# Patient Record
Sex: Male | Born: 1995 | Race: White | Hispanic: No | Smoking: Never smoker
Health system: Southern US, Community
[De-identification: ages and names within clinical notes are randomized; demographics above are authoritative.]

## PROBLEM LIST (undated history)

## (undated) DIAGNOSIS — Z8781 Personal history of (healed) traumatic fracture: Secondary | ICD-10-CM

## (undated) DIAGNOSIS — T791XXA Fat embolism (traumatic), initial encounter: Secondary | ICD-10-CM

## (undated) HISTORY — DX: Personal history of (healed) traumatic fracture: Z87.81

## (undated) HISTORY — DX: Fat embolism (traumatic), initial encounter: T79.1XXA

---
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
## 2016-06-30 ENCOUNTER — Encounter: Payer: Self-pay | Admitting: Family Medicine

## 2016-06-30 ENCOUNTER — Ambulatory Visit (INDEPENDENT_AMBULATORY_CARE_PROVIDER_SITE_OTHER): Payer: 59 | Admitting: Family Medicine

## 2016-06-30 VITALS — BP 110/62 | HR 82 | Temp 98.4°F | Resp 14 | Wt 146.0 lb

## 2016-06-30 DIAGNOSIS — L609 Nail disorder, unspecified: Secondary | ICD-10-CM | POA: Insufficient documentation

## 2016-06-30 NOTE — Assessment & Plan Note (Signed)
Appears to be a medium brown pigment growing out from the base of the nail; it is new, and I'd like this checked by dermatologist; referral entered

## 2016-06-30 NOTE — Progress Notes (Signed)
BP 110/62 mmHg  Pulse 82  Temp(Src) 98.4 F (36.9 C) (Oral)  Resp 14  Wt 146 lb (66.225 kg)  SpO2 96%   Subjective:    Patient ID: Donald Ferguson, male    DOB: [birth year], 20 y.o.   MRN: [id number]  HPI: Donald Ferguson is a 20 y.o. male  Chief Complaint  Patient presents with  . Nail Problem   Patient is here with his mother; has dark line in the right thumb; no pain whatsoever; does not affect the nail at all; does not feel bump or ridge there; not getting any wider; no pain; no other changes in any other nails No SHOB; no fevers, no chest pain; good energy level is good  Patient had an accident on a skateboard last July, had a fat embolism to the lung; in [place], at [facility] in [place]; broke left arm Different arm  Depression screen [facility] 2/9 06/30/2016 12/31/2015 10/05/2015 07/08/2015  Decreased Interest 0 0 0 0  Down, Depressed, Hopeless 0 0 0 0  PHQ - 2 Score 0 0 0 0   Relevant past medical, surgical, family and social history reviewed Past Medical History  Diagnosis Date  . Fat embolism (traumatic) (HCC)   . History of humerus fracture    History reviewed. No pertinent past surgical history.   Family History  Problem Relation Age of Onset  . Asthma Father   MD note: positive for diabetes and hypertension in grandparents on mother's side  Social History  Substance Use Topics  . Smoking status: Never Smoker   . Smokeless tobacco: None  . Alcohol Use: No   Interim medical history since last visit reviewed. Allergies and medications reviewed  Review of Systems Per HPI unless specifically indicated above     Objective:    BP 110/62 mmHg  Pulse 82  Temp(Src) 98.4 F (36.9 C) (Oral)  Resp 14  Wt 146 lb (66.225 kg)  SpO2 96%  Wt Readings from Last 3 Encounters:  06/30/16 146 lb (66.225 kg) (40 %*, Z = -0.26)  12/31/15 143 lb 8 oz (65.091 kg) (39 %*, Z = -0.29)  10/05/15 139 lb 12.8 oz (63.413 kg) (34 %*, Z = -0.41)   *  Growth percentiles are based on CDC 2-20 Years data.    Physical Exam  Constitutional: He appears well-developed and well-nourished. No distress.  HENT:  Head: Normocephalic and atraumatic.  Cardiovascular: Normal rate, regular rhythm and normal heart sounds.  Exam reveals no friction rub.   No murmur heard. Pulmonary/Chest: Effort normal and breath sounds normal. He has no wheezes.  Skin: Skin is warm and dry. No pallor.  1 mm uniformly brown pigmented stripe in the fingernail of the right thumb; no swelling over the cuticle; nontender; no palpable ridges; no other changes noted in the nails of the fingers or toes; no splinter hemorrhages  Psychiatric: He has a normal mood and affect. His behavior is normal.      Assessment & Plan:   Problem List Items Addressed This Visit      Musculoskeletal and Integument   Nail abnormality - Primary    Appears to be a medium brown pigment growing out from the base of the nail; it is new, and I'd like this checked by dermatologist; referral entered      Relevant Orders   Ambulatory referral to Dermatology      Follow up plan: No Follow-up on file.  An after-visit summary was printed and  given to the patient at check-out.  Please see the patient instructions which may contain other information and recommendations beyond what is mentioned above in the assessment and plan.  No orders of the defined types were placed in this encounter.    Orders Placed This Encounter  Procedures  . Ambulatory referral to Dermatology

## 2016-06-30 NOTE — Patient Instructions (Addendum)
We'll have you see the dermatologist Good luck, Go Pack!

## 2017-08-04 ENCOUNTER — Emergency Department
Admission: EM | Admit: 2017-08-04 | Discharge: 2017-08-04 | Disposition: A | Discharge status: Home / Self Care | Payer: Self-pay | Attending: Emergency Medicine | Admitting: Emergency Medicine

## 2017-08-04 ENCOUNTER — Encounter: Payer: Self-pay | Admitting: *Deleted

## 2017-08-04 ENCOUNTER — Emergency Department: Payer: Self-pay

## 2017-08-04 DIAGNOSIS — K1121 Acute sialoadenitis: Secondary | ICD-10-CM | POA: Insufficient documentation

## 2017-08-04 LAB — CBC WITH DIFFERENTIAL/PLATELET
Basophils Absolute: 0.1 10*3/uL (ref 0–0.1)
Basophils Relative: 0 %
EOS PCT: 0 %
Eosinophils Absolute: 0 10*3/uL (ref 0–0.7)
HCT: 41.5 % (ref 40.0–52.0)
Hemoglobin: 14.3 g/dL (ref 13.0–18.0)
LYMPHS ABS: 1 10*3/uL (ref 1.0–3.6)
LYMPHS PCT: 8 %
MCH: 30.4 pg (ref 26.0–34.0)
MCHC: 34.5 g/dL (ref 32.0–36.0)
MCV: 88.2 fL (ref 80.0–100.0)
Monocytes Absolute: 1.7 10*3/uL — ABNORMAL HIGH (ref 0.2–1.0)
Monocytes Relative: 15 %
Neutro Abs: 9.2 10*3/uL — ABNORMAL HIGH (ref 1.4–6.5)
Neutrophils Relative %: 77 %
PLATELETS: 255 10*3/uL (ref 150–440)
RBC: 4.7 MIL/uL (ref 4.40–5.90)
RDW: 13 % (ref 11.5–14.5)
WBC: 12 10*3/uL — ABNORMAL HIGH (ref 3.8–10.6)

## 2017-08-04 LAB — BASIC METABOLIC PANEL
ANION GAP: 9 (ref 5–15)
BUN: 12 mg/dL (ref 6–20)
CHLORIDE: 103 mmol/L (ref 101–111)
CO2: 27 mmol/L (ref 22–32)
Calcium: 9.4 mg/dL (ref 8.9–10.3)
Creatinine, Ser: 0.86 mg/dL (ref 0.61–1.24)
GFR calc Af Amer: >60 mL/min (ref >60)
GFR calc non Af Amer: >60 mL/min (ref >60)
Glucose, Bld: 79 mg/dL (ref 65–99)
POTASSIUM: 4 mmol/L (ref 3.5–5.1)
Sodium: 139 mmol/L (ref 135–145)

## 2017-08-04 MED ORDER — DEXAMETHASONE SODIUM PHOSPHATE 10 MG/ML IJ SOLN
20.0000 mg | Freq: Once | INTRAMUSCULAR | Status: AC
  Administered 2017-08-04: 20 mg via INTRAVENOUS
  Filled 2017-08-04: qty 2

## 2017-08-04 MED ORDER — IOPAMIDOL (ISOVUE-300) INJECTION 61%
75.0000 mL | Freq: Once | INTRAVENOUS | Status: AC | PRN
Start: 2017-08-04 — End: 2017-08-04
  Administered 2017-08-04: 75 mL via INTRAVENOUS
  Filled 2017-08-04: qty 75

## 2017-08-04 MED ORDER — ACETAMINOPHEN-CODEINE #3 300-30 MG PO TABS: 1.0000 | ORAL_TABLET | Freq: Three times a day (TID) | ORAL | 0 refills | Status: AC | PRN

## 2017-08-04 MED ORDER — CLINDAMYCIN PHOSPHATE 600 MG/50ML IV SOLN
600.0000 mg | Freq: Once | INTRAVENOUS | Status: AC
  Administered 2017-08-04: 600 mg via INTRAVENOUS
  Filled 2017-08-04: qty 50

## 2017-08-04 MED ORDER — AMOXICILLIN-POT CLAVULANATE 875-125 MG PO TABS: 1.0000 | ORAL_TABLET | Freq: Two times a day (BID) | ORAL | 0 refills | Status: DC

## 2017-08-04 MED ORDER — PREDNISONE 10 MG (21) PO TBPK: ORAL_TABLET | ORAL | 0 refills | Status: DC

## 2017-08-04 NOTE — Discharge Instructions (Signed)
Your exam, labs, and CT scan are consistent with a viral parotid gland swelling. We are treating with empiric antibiotics, steroids, and pain medicine. Continue to monitor symptoms, and drink plenty of fluids. Follow-up with Dr. Jenne Campus for further evaluation. Return to the ED as needed.

## 2017-08-04 NOTE — ED Notes (Signed)
NAD noted at time of D/C. Pt denies questions or concerns. Pt ambulatory to the lobby at this time.  

## 2017-08-04 NOTE — ED Notes (Signed)
RN spoke to MD and was told no blood work was needed at this time. [facility] to ask if pt could be seen there. Will continue to monitor pt.

## 2017-08-04 NOTE — ED Triage Notes (Signed)
Pt to ED reporting left sided facial swelling for the past 2 days. PT was seen at [facility] and told to come to ED to rule out abscess or mastoiditis. No fevers reported at home. No SOB, difficulty breathing or difficulty swallowing. Pt reports pain with chewing. No hx of tooth problems. Pt reports he does not floss but denies poor dental hygiene.

## 2017-08-04 NOTE — ED Provider Notes (Signed)
[facility] Emergency Department Provider Note ____________________________________________  Time seen: 1543  I have reviewed the triage vital signs and the nursing notes.  HISTORY  Chief Complaint  Facial Swelling  HPI Donald Ferguson is a 21 y.o. male presents to the ED for left-sided facial swelling for the last 2 days. Patient was recently seen in [facility], and advised to report directly to the ED to rule out abscess versus mastoiditis. The patient denies any interim fevers, chills, sweats. Denies any ear pain, swelling, or diarrhea. Also denies any significant shortness breath or difficulty breathing, or control secretions. He does note some pain with chewing and notes fullness to the left jaw. He has no history of any recent dental work, dental cavities, or dental pain.  Past Medical History:  Diagnosis Date  . Fat embolism (traumatic) (HCC)   . History of humerus fracture     Patient Active Problem List   Diagnosis Date Noted  . Nail abnormality 06/30/2016  . Annual physical exam 12/31/2015  . History of humerus fracture 06/26/2015    History reviewed. No pertinent surgical history.  Prior to Admission medications   Medication Sig Start Date End Date Taking? Authorizing Provider  acetaminophen-codeine (TYLENOL #3) 300-30 MG tablet Take 1 tablet by mouth 3 (three) times daily as needed for moderate pain. 08/04/17   Markeis Allman, Charlesetta Ivory, PA-C  amoxicillin-clavulanate (AUGMENTIN) 875-125 MG tablet Take 1 tablet by mouth 2 (two) times daily. 08/04/17   Dafina Suk, Charlesetta Ivory, PA-C  predniSONE (STERAPRED UNI-PAK 21 TAB) 10 MG (21) TBPK tablet 6-day taper as directed. 08/04/17   Jorrell Kuster, Charlesetta Ivory, PA-C    Allergies Patient has no known allergies.  Family History  Problem Relation Age of Onset  . Asthma Father     Social History Social History  Substance Use Topics  . Smoking status: Never Smoker  . Smokeless tobacco: Never Used  .  Alcohol use No    Review of Systems  Constitutional: Negative for fever. Eyes: Negative for visual changes. ENT: Negative for sore throat. Neck swelling.  Cardiovascular: Negative for chest pain. Respiratory: Negative for shortness of breath. Skin: Negative for rash. ____________________________________________  PHYSICAL EXAM:  VITAL SIGNS: ED Triage Vitals  Enc Vitals Group     BP 08/04/17 1445 (!) 129/59     Pulse Rate 08/04/17 1445 (!) 106     Resp 08/04/17 1445 16     Temp 08/04/17 1445 99.2 F (37.3 C)     Temp Source 08/04/17 1445 Oral     SpO2 08/04/17 1445 100 %     Weight 08/04/17 1446 149 lb (67.6 kg)     Height 08/04/17 1446 6' (1.829 m)     Head Circumference --      Peak Flow --      Pain Score 08/04/17 1445 2     Pain Loc --      Pain Edu? --      Excl. in GC? --     Constitutional: Alert and oriented. Well appearing and in no distress. Head: Normocephalic and atraumatic. Eyes: Conjunctivae are normal. PERRL. Normal extraocular movements Ears: Canals clear. TMs intact bilaterally. Nose: No congestion/rhinorrhea/epistaxis. Mouth/Throat: Mucous membranes are moist. Uvula is midline and tonsils are flat. No oropharyngeal lesions noted.  Neck: Supple. No thyromegaly. Left parotid gland is enlarged and mildly tender. Overlying erythema is noted with extension over the left mastoid.  Hematological/Lymphatic/Immunological: No cervical lymphadenopathy. Cardiovascular: Normal rate, regular rhythm. Normal distal pulses.  Respiratory: Normal respiratory effort. No wheezes/rales/rhonchi. Gastrointestinal: Soft and nontender. No distention. Neurologic:  Normal gait without ataxia. Normal speech and language. No gross focal neurologic deficits are appreciated. Skin:  Skin is warm, dry and intact. No rash noted. ____________________________________________   LABS (pertinent positives/negatives) Labs Reviewed  CBC WITH DIFFERENTIAL/PLATELET - Abnormal; Notable for the  following:       Result Value   WBC 12.0 (*)    Neutro Abs 9.2 (*)    Monocytes Absolute 1.7 (*)    All other components within normal limits  BASIC METABOLIC PANEL  ____________________________________________   RADIOLOGY  CT Neck w/ CM  IMPRESSION: Left parotiditis without stone or abscess. ____________________________________________  PROCEDURES  Decadron 20 mg IVP Clindamycin 600 mg IVPB ____________________________________________  INITIAL IMPRESSION / ASSESSMENT AND PLAN / ED COURSE  Patient evaluation of what appears to be an acute viral parotitis. His CT scan is negative for any abscess or stone for patient to the left parotid gland. His labs are consistent with a monocytosis with a small bump in his total white cell count. He is discharged at this time with shortness for prednisone, Augmentin, and Tylenol #3. He will be referred to Dr. Jenne Campus for ongoing symptom management. ____________________________________________  FINAL CLINICAL IMPRESSION(S) / ED DIAGNOSES  Final diagnoses:  Acute parotitis     Lissa Hoard, PA-C 08/04/17 1932    Phineas Semen, MD 08/04/17 2033

## 2018-06-25 DIAGNOSIS — J029 Acute pharyngitis, unspecified: Secondary | ICD-10-CM | POA: Diagnosis not present

## 2018-07-11 DIAGNOSIS — J019 Acute sinusitis, unspecified: Secondary | ICD-10-CM | POA: Diagnosis not present

## 2018-07-18 ENCOUNTER — Encounter: Payer: Self-pay | Admitting: Family Medicine

## 2018-07-23 ENCOUNTER — Ambulatory Visit: Payer: 59 | Admitting: Family Medicine

## 2018-07-23 ENCOUNTER — Emergency Department: Payer: 59

## 2018-07-23 ENCOUNTER — Emergency Department
Admission: EM | Admit: 2018-07-23 | Discharge: 2018-07-23 | Disposition: A | Discharge status: Home / Self Care | Payer: 59 | Attending: Emergency Medicine | Admitting: Emergency Medicine

## 2018-07-23 ENCOUNTER — Other Ambulatory Visit: Payer: Self-pay

## 2018-07-23 ENCOUNTER — Encounter: Payer: Self-pay | Admitting: Emergency Medicine

## 2018-07-23 ENCOUNTER — Encounter: Payer: Self-pay | Admitting: Family Medicine

## 2018-07-23 VITALS — BP 104/62 | HR 145 | Temp 98.5°F | Wt 149.4 lb

## 2018-07-23 DIAGNOSIS — R231 Pallor: Secondary | ICD-10-CM | POA: Diagnosis not present

## 2018-07-23 DIAGNOSIS — R509 Fever, unspecified: Secondary | ICD-10-CM | POA: Diagnosis present

## 2018-07-23 DIAGNOSIS — B2790 Infectious mononucleosis, unspecified without complication: Secondary | ICD-10-CM | POA: Insufficient documentation

## 2018-07-23 DIAGNOSIS — R000 Tachycardia, unspecified: Secondary | ICD-10-CM | POA: Diagnosis not present

## 2018-07-23 DIAGNOSIS — J029 Acute pharyngitis, unspecified: Secondary | ICD-10-CM | POA: Diagnosis not present

## 2018-07-23 DIAGNOSIS — R911 Solitary pulmonary nodule: Secondary | ICD-10-CM | POA: Diagnosis not present

## 2018-07-23 LAB — CBC
HCT: 43.3 % (ref 40.0–52.0)
HEMOGLOBIN: 15.3 g/dL (ref 13.0–18.0)
MCH: 31.6 pg (ref 26.0–34.0)
MCHC: 35.4 g/dL (ref 32.0–36.0)
MCV: 89.3 fL (ref 80.0–100.0)
Platelets: 291 10*3/uL (ref 150–440)
RBC: 4.85 MIL/uL (ref 4.40–5.90)
RDW: 13.3 % (ref 11.5–14.5)
WBC: 7.5 10*3/uL (ref 3.8–10.6)

## 2018-07-23 LAB — CBC WITH DIFFERENTIAL/PLATELET
BASOS ABS: 0 10*3/uL (ref 0–0.1)
BLASTS: 0 %
Band Neutrophils: 0 %
Basophils Relative: 0 %
Eosinophils Absolute: 0 10*3/uL (ref 0–0.7)
Eosinophils Relative: 0 %
HEMATOCRIT: 41.9 % (ref 40.0–52.0)
HEMOGLOBIN: 14.9 g/dL (ref 13.0–18.0)
Lymphocytes Relative: 42 %
Lymphs Abs: 2.9 10*3/uL (ref 1.0–3.6)
MCH: 31.7 pg (ref 26.0–34.0)
MCHC: 35.6 g/dL (ref 32.0–36.0)
MCV: 88.9 fL (ref 80.0–100.0)
MONOS PCT: 20 %
Metamyelocytes Relative: 0 %
Monocytes Absolute: 1.4 10*3/uL — ABNORMAL HIGH (ref 0.2–1.0)
Myelocytes: 0 %
NEUTROS ABS: 2.5 10*3/uL (ref 1.4–6.5)
Neutrophils Relative %: 36 %
OTHER: 2 %
Platelets: 267 10*3/uL (ref 150–440)
Promyelocytes Relative: 0 %
RBC: 4.72 MIL/uL (ref 4.40–5.90)
RDW: 13.3 % (ref 11.5–14.5)
WBC: 7 10*3/uL (ref 3.8–10.6)
nRBC: 0 /100 WBC

## 2018-07-23 LAB — RAPID HIV SCREEN (HIV 1/2 AB+AG)
HIV 1/2 ANTIBODIES: NONREACTIVE
HIV-1 P24 Antigen - HIV24: NONREACTIVE

## 2018-07-23 LAB — HEPATIC FUNCTION PANEL
ALT: 140 U/L — AB (ref 0–44)
AST: 105 U/L — AB (ref 15–41)
Albumin: 4.3 g/dL (ref 3.5–5.0)
Alkaline Phosphatase: 88 U/L (ref 38–126)
BILIRUBIN DIRECT: 0.2 mg/dL (ref 0.0–0.2)
Indirect Bilirubin: 0.6 mg/dL (ref 0.3–0.9)
Total Bilirubin: 0.8 mg/dL (ref 0.3–1.2)
Total Protein: 7.5 g/dL (ref 6.5–8.1)

## 2018-07-23 LAB — GROUP A STREP BY PCR: GROUP A STREP BY PCR: NOT DETECTED

## 2018-07-23 LAB — BASIC METABOLIC PANEL
ANION GAP: 6 (ref 5–15)
BUN: 11 mg/dL (ref 6–20)
CALCIUM: 9.4 mg/dL (ref 8.9–10.3)
CO2: 29 mmol/L (ref 22–32)
Chloride: 103 mmol/L (ref 98–111)
Creatinine, Ser: 0.98 mg/dL (ref 0.61–1.24)
Glucose, Bld: 135 mg/dL — ABNORMAL HIGH (ref 70–99)
Potassium: 4.2 mmol/L (ref 3.5–5.1)
Sodium: 138 mmol/L (ref 135–145)

## 2018-07-23 LAB — CK: CK TOTAL: 48 U/L — AB (ref 49–397)

## 2018-07-23 LAB — LACTIC ACID, PLASMA: Lactic Acid, Venous: 1 mmol/L (ref 0.5–1.9)

## 2018-07-23 LAB — MONONUCLEOSIS SCREEN: Mono Screen: POSITIVE — AB

## 2018-07-23 LAB — LIPASE, BLOOD: Lipase: 29 U/L (ref 11–51)

## 2018-07-23 LAB — TROPONIN I

## 2018-07-23 MED ORDER — IBUPROFEN 600 MG PO TABS
600.0000 mg | ORAL_TABLET | ORAL | Status: AC
Start: 2018-07-23 — End: 2018-07-23
  Administered 2018-07-23: 600 mg via ORAL
  Filled 2018-07-23: qty 1

## 2018-07-23 MED ORDER — SODIUM CHLORIDE 0.9 % IV BOLUS
1000.0000 mL | Freq: Once | INTRAVENOUS | Status: AC
  Administered 2018-07-23: 1000 mL via INTRAVENOUS

## 2018-07-23 MED ORDER — IOHEXOL 350 MG/ML SOLN
100.0000 mL | Freq: Once | INTRAVENOUS | Status: AC | PRN
  Administered 2018-07-23: 100 mL via INTRAVENOUS

## 2018-07-23 NOTE — ED Provider Notes (Signed)
[facility] Emergency Department Provider Note   ____________________________________________   First MD Initiated Contact with Patient 07/23/18 1530     (approximate)  I have reviewed the triage vital signs and the nursing notes.   HISTORY  Chief Complaint Tachycardia    HPI Donald Ferguson is a 22 y.o. male referred from primary care for concerns of notable tachycardia, lymphadenopathy and persistent fevers over about a month  Patient and his family are present, they report that about 1 month ago he developed a sore throat and was treated with about 10 days of Augmentin.  The sore throat got better but he felt very fatigued for several weeks and has had persistent fatigue and often having low-grade temperatures about 100 102 for almost a month off and on.  Over the last couple of days he is again reexperiencing a feeling of a sore throat a little more on the left tonsil than on the right.  Reports he continues to feel fatigued and saw his primary care doctor today and they referred him for further evaluation  He denies chest pain or trouble breathing.  He has not had a cough.  He had been treated with antibiotics a couple weeks ago for possible strep throat, and reports that urgent care that he believes they did a strep and mono test and was told that mono was at least negative.  No recent travel.  No tick bites.  No insect bites or rash.  No headache or neck pain.  He has not lost any weight.  He continues to eat and drink well no abdominal pain.  Right now he reports only thing is really having is just a very mild sore throat without trouble swallowing.  Continued fevers and just an ongoing feeling of fatigue.  No pain or burning with urination.  No abdominal symptoms.  No diarrhea.  No headache or neck pain.  No travel history.  Past Medical History:  Diagnosis Date  . Fat embolism (traumatic) (HCC)   . History of humerus fracture     Patient  Active Problem List   Diagnosis Date Noted  . Nail abnormality 06/30/2016  . Annual physical exam 12/31/2015  . History of humerus fracture 06/26/2015    History reviewed. No pertinent surgical history.  Prior to Admission medications   Medication Sig Start Date End Date Taking? Authorizing Provider  acetaminophen-codeine (TYLENOL #3) 300-30 MG tablet Take 1 tablet by mouth 3 (three) times daily as needed for moderate pain. 08/04/17   Menshew, Charlesetta IvoryJenise V Bacon, PA-C    Allergies Patient has no known allergies.  Family History  Problem Relation Age of Onset  . Asthma Father     Social History Social History   Tobacco Use  . Smoking status: Never Smoker  . Smokeless tobacco: Never Used  Substance Use Topics  . Alcohol use: No    Alcohol/week: 0.0 standard drinks  . Drug use: Not on file    Review of Systems Constitutional: See HPI Eyes: No visual changes. ENT: See HPI Cardiovascular: Denies chest pain. Respiratory: Denies shortness of breath. Gastrointestinal: No abdominal pain.  No nausea, no vomiting.  No diarrhea.  No constipation. Genitourinary: Negative for dysuria. Musculoskeletal: Negative for back pain.  No joint aches. Skin: Negative for rash.  No known tick or insect bites. Neurological: Negative for headaches, focal weakness or numbness.    ____________________________________________   PHYSICAL EXAM:  VITAL SIGNS: ED Triage Vitals  Enc Vitals Group  BP 07/23/18 1517 125/78     Pulse Rate 07/23/18 1517 (!) 145     Resp 07/23/18 1517 16     Temp 07/23/18 1517 99.3 F (37.4 C)     Temp Source 07/23/18 1517 Oral     SpO2 07/23/18 1517 97 %     Weight 07/23/18 1518 149 lb 7.6 oz (67.8 kg)     Height 07/23/18 1518 6' (1.829 m)     Head Circumference --      Peak Flow --      Pain Score 07/23/18 1518 4     Pain Loc --      Pain Edu? --      Excl. in GC? --     Constitutional: Alert and oriented. Well appearing and in no acute distress.  He  is very pleasant.  Monitor does show that he is tachycardic to about 120. Eyes: Conjunctivae are normal. Head: Atraumatic. Nose: No congestion/rhinnorhea. Mouth/Throat: Mucous membranes are moist.  There is erythema of the posterior pharyngeal columns, there is mild tonsillar hypertrophy slightly greater on the left than the right and some slight exudates are noted on the tonsils bilaterally.  There is shotty anterior cervical anterior adenopathy including slightly more over the left and also some slight adenopathy over the clavicles bilaterally. Neck: No stridor.  No meningismus. Cardiovascular: Normal rate, regular rhythm. Grossly normal heart sounds.  Good peripheral circulation. Respiratory: Normal respiratory effort.  No retractions. Lungs CTAB. Gastrointestinal: Soft and nontender. No distention. Musculoskeletal: No lower extremity tenderness nor edema. Neurologic:  Normal speech and language. No gross focal neurologic deficits are appreciated.  Skin:  Skin is warm, dry and intact. No rash noted. Psychiatric: Mood and affect are normal. Speech and behavior are normal.  ____________________________________________   LABS (all labs ordered are listed, but only abnormal results are displayed)  Labs Reviewed  BASIC METABOLIC PANEL - Abnormal; Notable for the following components:      Result Value   Glucose, Bld 135 (*)    All other components within normal limits  CBC WITH DIFFERENTIAL/PLATELET - Abnormal; Notable for the following components:   Monocytes Absolute 1.4 (*)    All other components within normal limits  CK - Abnormal; Notable for the following components:   Total CK 48 (*)    All other components within normal limits  HEPATIC FUNCTION PANEL - Abnormal; Notable for the following components:   AST 105 (*)    ALT 140 (*)    All other components within normal limits  MONONUCLEOSIS SCREEN - Abnormal; Notable for the following components:   Mono Screen POSITIVE (*)     All other components within normal limits  GROUP A STREP BY PCR  CBC  TROPONIN I  LACTIC ACID, PLASMA  LIPASE, BLOOD  RAPID HIV SCREEN (HIV 1/2 AB+AG)   ____________________________________________  EKG  Reviewed and interpreted by me at 1513 Heart rate 140 QRS 85 QTc 420 Sinus tachycardia, no ischemic changes. ____________________________________________  RADIOLOGY  Dg Chest 2 View  Result Date: 07/23/2018 CLINICAL DATA:  Sore throat and congestion over the past week. EXAM: CHEST - 2 VIEW COMPARISON:  Chest x-ray dated October 19, 2015. FINDINGS: The heart size and mediastinal contours are within normal limits. Both lungs are clear. The visualized skeletal structures are unremarkable. IMPRESSION: Normal chest. Electronically Signed   By: Obie Dredge M.D.   On: 07/23/2018 16:07   Ct Soft Tissue Neck W Contrast  Result Date: 07/23/2018 CLINICAL DATA:  Sore  throat and stridor EXAM: CT NECK WITH CONTRAST TECHNIQUE: Multidetector CT imaging of the neck was performed using the standard protocol following the bolus administration of intravenous contrast. CONTRAST:  OMNIPAQUE IOHEXOL 350 MG/ML SOLN COMPARISON:  None. FINDINGS: PHARYNX AND LARYNX: --Nasopharynx: Fossae of Rosenmuller are clear. Normal adenoid tonsils for age. --Oral cavity and oropharynx: The palatine tonsils are enlarged without fluid collection or abscess. Unremarkable lingual tonsils. --Hypopharynx: Normal vallecula and pyriform sinuses. --Larynx: Normal epiglottis and pre-epiglottic space. Normal aryepiglottic and vocal folds. --Retropharyngeal space: No abscess, effusion or lymphadenopathy. SALIVARY GLANDS: --Parotid: No mass lesion or inflammation. No sialolithiasis or ductal dilatation. --Submandibular: Symmetric without inflammation. No sialolithiasis or ductal dilatation. --Sublingual: Normal. No ranula or other visible lesion of the base of tongue and floor of mouth. THYROID: Normal. LYMPH NODES: Right level 2A  nodes measure up to 15 mm. Left level 2A nodes measure up to 17 mm. VASCULAR: Major cervical vessels are patent. LIMITED INTRACRANIAL: Normal. VISUALIZED ORBITS: Normal. MASTOIDS AND VISUALIZED PARANASAL SINUSES: No fluid levels or advanced mucosal thickening. No mastoid effusion. SKELETON: No bony spinal canal stenosis. No lytic or blastic lesions. UPPER CHEST: Clear. OTHER: None. IMPRESSION: 1. Enlarged palatine tonsils without peritonsillar abscess or other fluid collection, consistent with tonsillopharyngitis. 2. Bilateral reactive cervical lymphadenopathy. 3. Normal epiglottis.  No airway stenosis. Electronically Signed   By: Deatra Robinson M.D.   On: 07/23/2018 16:51   Ct Angio Chest Pe W And/or Wo Contrast  Result Date: 07/23/2018 CLINICAL DATA:  Initial evaluation for persistent tachycardia. EXAM: CT ANGIOGRAPHY CHEST WITH CONTRAST TECHNIQUE: Multidetector CT imaging of the chest was performed using the standard protocol during bolus administration of intravenous contrast. Multiplanar CT image reconstructions and MIPs were obtained to evaluate the vascular anatomy. CONTRAST:  OMNIPAQUE IOHEXOL 350 MG/ML SOLN COMPARISON:  Prior radiograph from 07/23/2018 FINDINGS: Cardiovascular: Intrathoracic aorta of normal caliber without aneurysm or other acute abnormality. Visualized great vessels within normal limits. Heart size normal. No pericardial effusion. Pulmonary arterial tree adequately opacified for evaluation. Main pulmonary artery within normal limits for caliber. No filling defect to suggest acute pulmonary embolism. Re-formatted imaging confirms these findings. Mediastinum/Nodes: Visualized thyroid is normal. No enlarged mediastinal, hilar, or axillary lymph nodes identified. Normal residual thymic tissue noted within the anterior mediastinum. Esophagus within normal limits. Lungs/Pleura: Tracheobronchial tree widely intact and patent. Lungs are well inflated bilaterally. No focal infiltrates. No  pulmonary edema or pleural effusion. No pneumothorax. Single 6 mm nodule present at the medial right upper lobe (series 5, image 28), indeterminate. No other pulmonary nodule or mass. Upper Abdomen: Visualized upper abdomen demonstrates no acute finding. Musculoskeletal: No acute osseous abnormality. No worrisome lytic or blastic osseous lesions. Review of the MIP images confirms the above findings. IMPRESSION: 1. No CTA evidence for acute pulmonary embolism. 2. No other acute cardiopulmonary abnormality identified. 3. Single 6 mm right upper lobe pulmonary nodule as above, indeterminate. Given patient age, finding felt to be of doubtful clinical significance. Please note that Fleischner criteria do not apply in patients of this age. Electronically Signed   By: Rise Mu M.D.   On: 07/23/2018 17:09    ____________________________________________   PROCEDURES  Procedure(s) performed: None  Procedures  Critical Care performed: No  ____________________________________________   INITIAL IMPRESSION / ASSESSMENT AND PLAN / ED COURSE  Pertinent labs & imaging results that were available during my care of the patient were reviewed by me and considered in my medical decision making (see chart for details).  Clinical Course as of Jul 23 1824  Tue Jul 23, 2018  1555 Paged Dr. Sherie DonLada to discuss presentation and work-up.    [MQ]  1814 Vital signs normal after fluid bolus. HR currently 72   [MQ]    Clinical Course User Index [MQ] Sharyn CreamerQuale, Ginni Eichler, MD   After discussing with the patient and also with his primary care doctor we will provide a very detailed evaluation in the ER given his had persistent symptoms for about a month.  Though I suspect clinically his symptoms may be due to a viral syndrome or something like mononucleosis causing a pharyngitis or recurrent strep, his lymphadenopathy and tachycardia do suggest the need for further work-up.  We will perform CT scans of the neck and  chest as advised for consideration by his PCP after discussion, also a broad battery of lab testing including a smear to evaluate for blood dyscrasias/lymphoma clinically I find this to be less likely than a somewhat indolent infectious or viral pathology.  Very reassuring clinical exam except for his tachycardia, will treat with antipyretic and fluids and monitored for improvement in his tachycardia as we await his lab testing.  He has no associated chest symptoms chest pain or respiratory symptoms.  ----------------------------------------- 6:25 PM on 07/23/2018 -----------------------------------------  Monospot negative, this seems to explain symptoms well.  I suspect he has ongoing lingering mononucleosis.  His heart rate and vital signs have normalized, he is resting comfortably.  Discussed with Dr. Luna GlasgowLotta his primary care doctor, will discharge and she will follow-up closely.  She is already reviewed his labs, noted that he did have a small nodule as well which they will follow-up as an outpatient.  Return precautions and treatment recommendations and follow-up discussed with the patient who is agreeable with the plan.  ____________________________________________   FINAL CLINICAL IMPRESSION(S) / ED DIAGNOSES  Final diagnoses:  Pulmonary nodule  Infectious mononucleosis without complication, infectious mononucleosis due to unspecified organism      NEW MEDICATIONS STARTED DURING THIS VISIT:  New Prescriptions   No medications on file     Note:  This document was prepared using Dragon voice recognition software and may include unintentional dictation errors.     Sharyn CreamerQuale, Edwyna Dangerfield, MD 07/23/18 [phone]

## 2018-07-23 NOTE — Progress Notes (Signed)
BP 104/62   Pulse (!) 145   Temp 98.5 F (36.9 C)   Wt 149 lb 6.4 oz (67.8 kg)   SpO2 94%   BMI 20.26 kg/m    Subjective:    Patient ID: Donald Ferguson, male    DOB: [birth year], 22 y.o.   MRN: [id number]  HPI: Donald AguasStephen Isaac Ranker is a 22 y.o. male  No chief complaint on file.   HPI  Patient is here for an acute visit He has been having what has seemed like a viral illness over the last month He has had fever up to 100.8 degrees; when asked about night sweats, he says he sleeps in a hot environment He has had swelling along the left side of his neck He denies weight loss He denies excessive thirst, dry mouth, urination He does not feel like he is going to pass out  [facility] 06/30/2016 12/31/2015 10/05/2015 07/08/2015  Decreased Interest 0 0 0 0  Down, Depressed, Hopeless 0 0 0 0  PHQ - 2 Score 0 0 0 0    Relevant past medical, surgical, family and social history reviewed Past Medical History:  Diagnosis Date  . Fat embolism (traumatic) (HCC)   . History of humerus fracture    No past surgical history on file. Family History  Problem Relation Age of Onset  . Asthma Father    Social History   Tobacco Use  . Smoking status: Never Smoker  . Smokeless tobacco: Never Used  Substance Use Topics  . Alcohol use: No    Alcohol/week: 0.0 standard drinks  . Drug use: Not on file    Interim medical history since last visit reviewed. Allergies and medications reviewed  Review of Systems Per HPI unless specifically indicated above     Objective:    BP 104/62   Pulse (!) 145   Temp 98.5 F (36.9 C)   Wt 149 lb 6.4 oz (67.8 kg)   SpO2 94%   BMI 20.26 kg/m   Wt Readings from Last 3 Encounters:  07/23/18 149 lb 7.6 oz (67.8 kg)  07/23/18 149 lb 6.4 oz (67.8 kg)  08/04/17 149 lb (67.6 kg)    Physical Exam  Constitutional: He appears well-developed and well-nourished. No distress.  Eyes: No scleral icterus.  Cardiovascular: Regular  rhythm.  No extrasystoles are present. Tachycardia present.  Pulmonary/Chest: Effort normal and breath sounds normal.  Lymphadenopathy:  Swelling along left side neck under angle of jaw  Neurological: He is alert.  Skin: He is not diaphoretic. There is pallor.  Psychiatric: He has a normal mood and affect.      Assessment & Plan:   Problem List Items Addressed This Visit    None    Visit Diagnoses    Tachycardia    -  Primary   patient taching in the 137-145 range; ddx includes anemia, leukemia, hyperthyroidism (though no wt loss), and he needs ER work-up now; transported to ER   Pallor       patient needs stat CBC; transporting to ER for evaluation to r/o anemia, leukemia, dehydration, etc.      Check-out given to ER staff with my concerns for getting stat CBC, glucose  Follow up plan: No follow-ups on file.  An after-visit summary was printed and given to the patient at check-out.  Please see the patient instructions which may contain other information and recommendations beyond what is mentioned above in the assessment and plan.  No orders  of the defined types were placed in this encounter.   No orders of the defined types were placed in this encounter.

## 2018-07-23 NOTE — ED Triage Notes (Signed)
Patient was sent to ED by PCP for tachycardia. Patient reports he has been taking antibiotics for sinusitis for the last week. Patient denies and CP, SOB. Patient states he feels "nervous" but denies any other symptoms.

## 2018-07-23 NOTE — ED Notes (Signed)
Patient transported to CT 

## 2019-06-10 ENCOUNTER — Telehealth: Payer: 59 | Admitting: Physician Assistant

## 2019-06-10 DIAGNOSIS — R509 Fever, unspecified: Secondary | ICD-10-CM

## 2019-06-10 NOTE — Progress Notes (Signed)
E-Visit for Corona Virus Screening   Your current symptoms could be consistent with the coronavirus.  Call your health care provider or local health department to request and arrange formal testing. Many health care providers can now test patients at their office but not all are.  Please quarantine yourself while awaiting your test results.  [name] [phone], [name], [name] [phone] or visit [email or web address]  and You have been enrolled in [facility] for COVID-19.  Daily you will receive a questionnaire within the [employer] website. Our COVID-19 response team will be monitoring your responses daily.    COVID-19 is a respiratory illness with symptoms that aresimilar to the flu. Symptoms are typically mild to moderate, but there have been cases of severe illness and death due to the virus. The following symptoms may appear 2-14 days after exposure: Fever Cough Shortness of breath or difficulty breathing Chills Repeated shaking with chills Muscle pain Headache Sore throat New loss of taste or smell Fatigue Congestion or runny nose Nausea or vomiting Diarrhea  It is vitally important that if you feel that you have an infection such as this virus or any other virus that you stay home and away from places where you may spread it to others.  You should self-quarantine for 14 days if you have symptoms that could potentially be coronavirus or have been in close contact a with a person diagnosed with COVID-19 within the last 2 weeks. You should avoid contact with people age 67 and older.   You should wear a mask or cloth face covering over your nose and mouth if you must be around other people or animals, including pets (even at home). Try to stay at least 6 feet away from other people. This will protect the people around  you.    You may also take acetaminophen (Tylenol) as needed for fever.   Reduce your risk of any infection by using the same precautions used for avoiding the common cold or flu: Wash your hands often with soap and warm water for at least 20 seconds.  If soap and water are not readily available, use an alcohol-based hand sanitizer with at least 60% alcohol.  If coughing or sneezing, cover your mouth and nose by coughing or sneezing into the elbow areas of your shirt or coat, into a tissue or into your sleeve (not your hands). Avoid shaking hands with others and consider head nods or verbal greetings only.  Avoid touching your eyes,nose, or mouth with unwashed hands. Avoid close contact with people who are sick. Avoid places or events with large numbers of people in one location, like concerts or sporting events. Carefully consider travel plans you have or are making. If you are planning any travel outside or inside the [place], visit the CDC'sTravelers' Health webpagefor the latest health notices. If you have some symptoms but not all symptoms, continue to monitor at home and seek medical attention if your symptoms worsen. If you are having a medical emergency, call 911.  HOME CARE Only take medications as instructed by your medical team. Drink plenty of fluids and get plenty of rest. A steam or ultrasonic humidifier can help if you have congestion.   GET HELP RIGHT AWAY IF YOU HAVE EMERGENCY WARNING SIGNS** FOR COVID-19. If you or someone is showing any of these signs seek emergency medical care immediately. Call 911 or proceed to your closest emergency facility if: You develop worsening high fever. Trouble  breathing Bluish lips or face Persistent pain or pressure in the chest New confusion Inability to wake or stay awake You cough up blood. Your symptoms become more severe  **This list is not all possible symptoms. Contact your medical provider for any symptoms that are sever or  concerning to you.   MAKE SURE YOU  Understand these instructions. Will watch your condition. Will get help right away if you are not doing well or get worse.  Your e-visit answers were reviewed by a board certified advanced clinical practitioner to complete your personal care plan.  Depending on the condition, your plan could have included both over the counter or prescription medications.  If there is a problem please reply once you have received a response from your provider.  Your safety is important to [place].  If you have drug allergies check your prescription carefully.    You can use MyChart to ask questions about today's visit, request a non-urgent call back, or ask for a work or school excuse for 24 hours related to this e-Visit. If it has been greater than 24 hours you will need to follow up with your provider, or enter a new e-Visit to address those concerns. You will get an e-mail in the next two days asking about your experience.  I hope that your e-visit has been valuable and will speed your recovery. Thank you for using e-visits.    ===View-only below this line===   ----- Message -----    From: Donald Ferguson    Sent: 06/10/2019 11:54 AM EDT      To: E-Visit Mailing List Subject: CoronaVirus (DJTTS-17) Screening  CoronaVirus (BLTJQ-30) Screening --------------------------------  Question: Do you have any of the following?  Answer:   Fever  Question: Do you have any of the following additional symptoms?  Answer:   Sore throat            Headache  Question: Have you had a fever? Answer:   Yes  Question: If you are running a fever, please type in your temperature reading Answer:   Not currently. It's been up and down. Most recent (highest) fever was 100.56F yesterday evening. Currently around 99.44F.  Question: How long have you had the fever? Answer:   For a few days  Question: Have others in your home or workplace had similar symptoms? Answer:    No  Question: When did your symptoms start? Answer:   Around Friday, the 26th.  Question: Have you recently visited any of the following countries? Answer:   None of these  Question: If you have traveled anywhere in the last  2 months please document where you have visited: Answer:   Was in [place], [place] for an afternoon/evening around June 10th. Haven't been anywhere else significant in the last 2 months.  Question: Have you recently been around others from these countries or visited these countries who have had coughing or fever? Answer:   No  Question: Have you recently been around anyone who has been diagnosed with Corona virus? Answer:   No  Question: Have you been taking any medications? Answer:   No  Question: If taking medications for these symptoms, please list the names and whether they are helping or not Answer:   Just vitamins (C). No noticeable effect.  Question: Are you treated for any of the following conditions: Asthma, COPD, Diabetes, Renal Failure (on Dialysis), AIDS, any Neuromuscular disease that effects the clearing of secretions, Heart Failure, or Heart Disease? Answer:  No  Question: Please enter a phone number where you can be reached if we have additional questions about your symptoms Answer:   [phone]  Question: Please list your medication allergies that you may have ? (If 'none' , please list as 'none') Answer:   none  Question: Please list any additional comments  Answer:   I had mono around this time last year. I noticed some abnormal exhaustion mid/late last week (which has since improved). Have been wondering whether this may be a recurrence of that, as I've heard that can happen? When I had that the primary symptoms were a generally high but inconsistent fever along with sore throat and exhaustion.                        My throat has improved since Friday night, and is now generally normal-feeling.            I had one headache around Saturday  or Sunday. Have not had one since, but figured I'd still list it as a symptom, as it may still be significant.                        Have experienced no fever-associated aches or chills. My only "feverish" symptom is that my body sometimes feels physically warm to touch.                        Most of my worry comes from the fact that my partner (who has no symptoms) still has to go to work.  A total of 5-10 minutes was spent evaluating this patients questionnaire and formulating a plan of care.

## 2020-07-25 IMAGING — CT CT NECK W/ CM
4 of 5 series · 13 of 33 positions shown, 15 images · IV contrast (APPLIED)
Comparison: None.

CLINICAL DATA: Sore throat and stridor

EXAM:
CT NECK WITH CONTRAST
TECHNIQUE: Multidetector CT imaging of the neck was performed using the
standard protocol following the bolus administration of intravenous
contrast.
CONTRAST:  100mL OMNIPAQUE IOHEXOL 350 MG/ML SOLN

[Series 5: axial neck · axial · 0.57mm/px · z∈[-395,-231]mm · 4 of 138 slices shown, 5 images]
[im 28/138  soft-tissue]
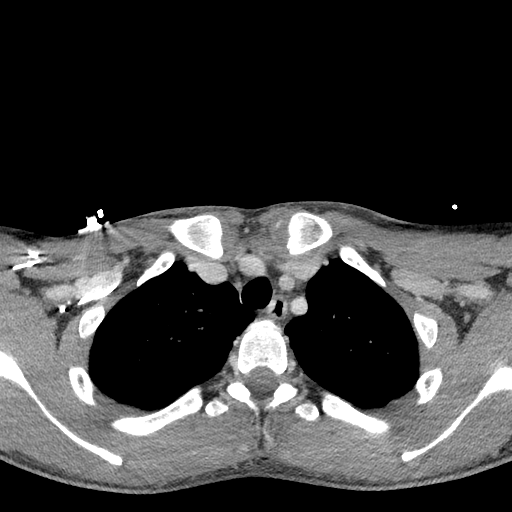
[im 28/138  bone]
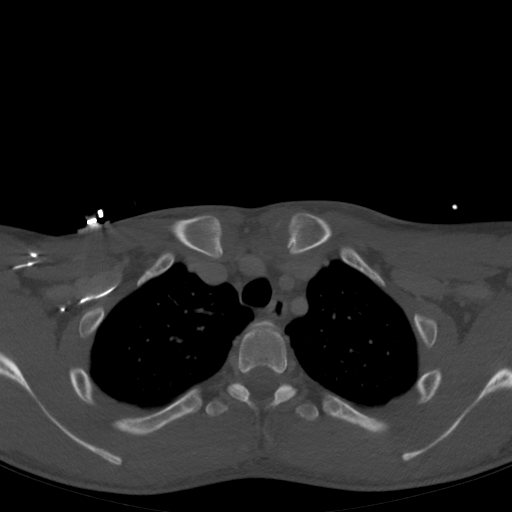
[im 55/138  bone]
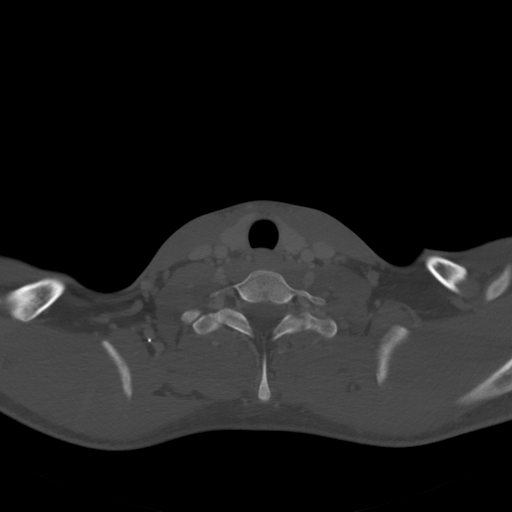
[im 83/138  bone]
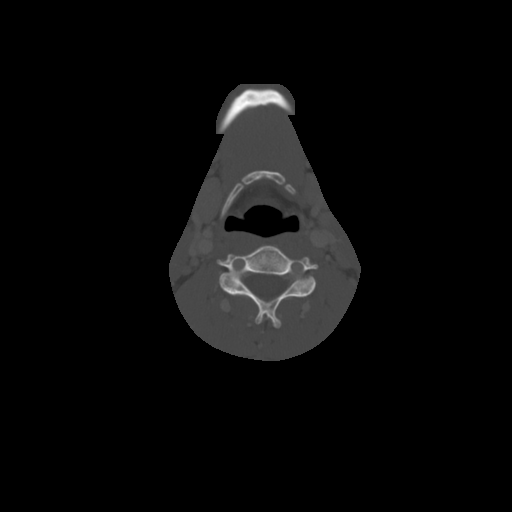
[im 110/138  bone]
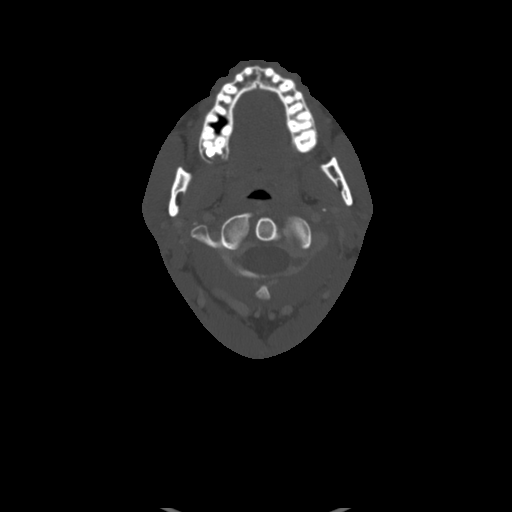

[Series 9: sag neck · sagittal · 0.43mm/px · 5 of 75 slices shown, 6 images]
[im 25/75  bone]
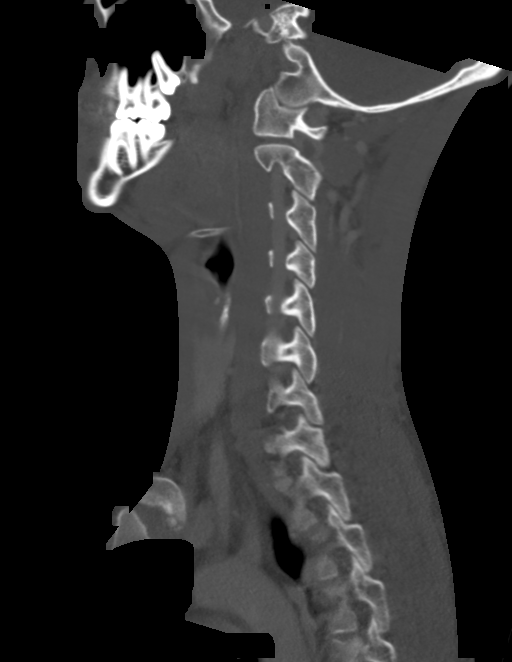
[im 31/75  bone]
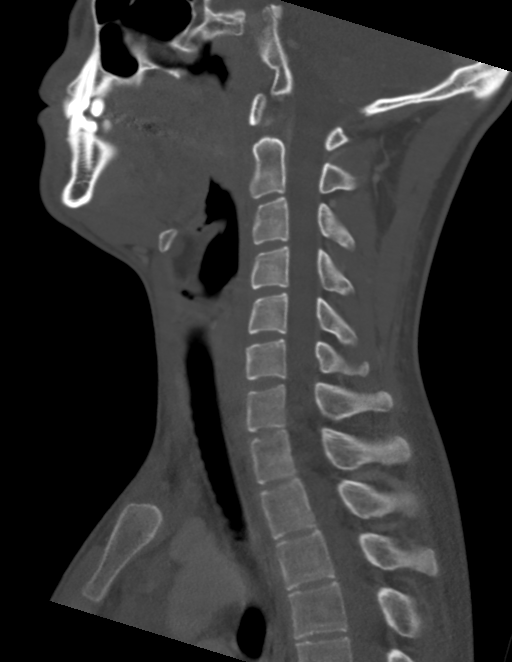
[im 38/75  soft-tissue]
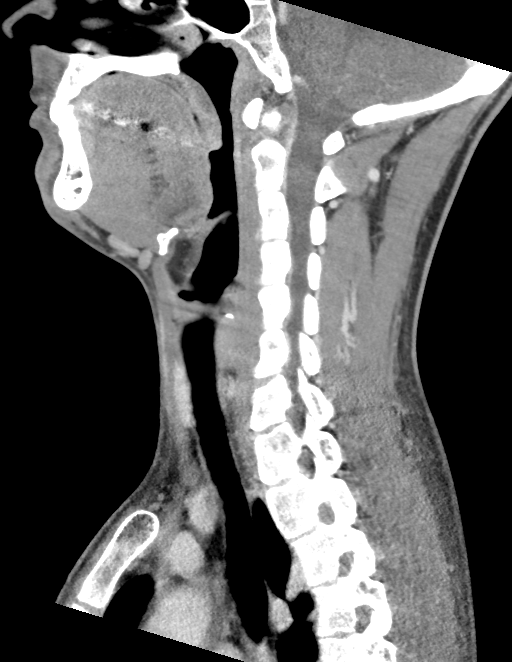
[im 38/75  bone]
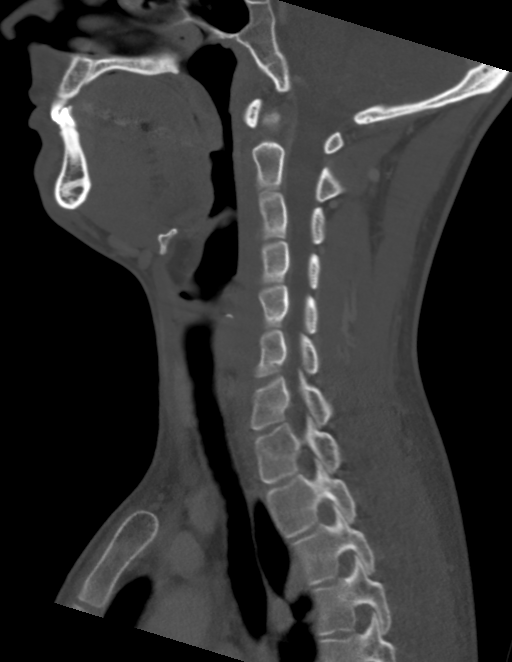
[im 44/75  bone]
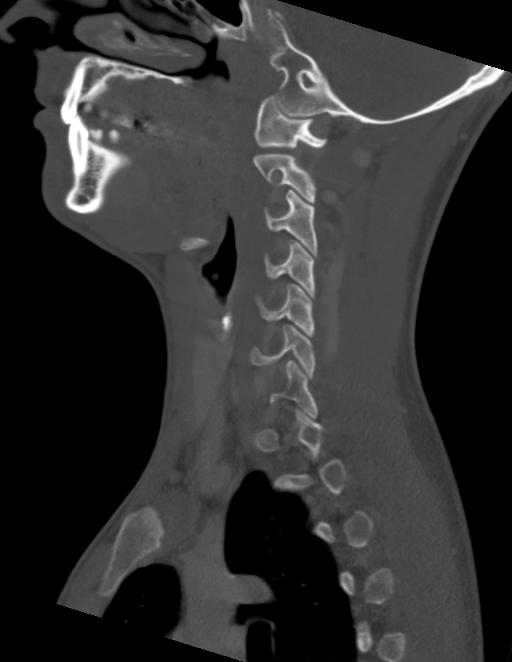
[im 50/75  bone]
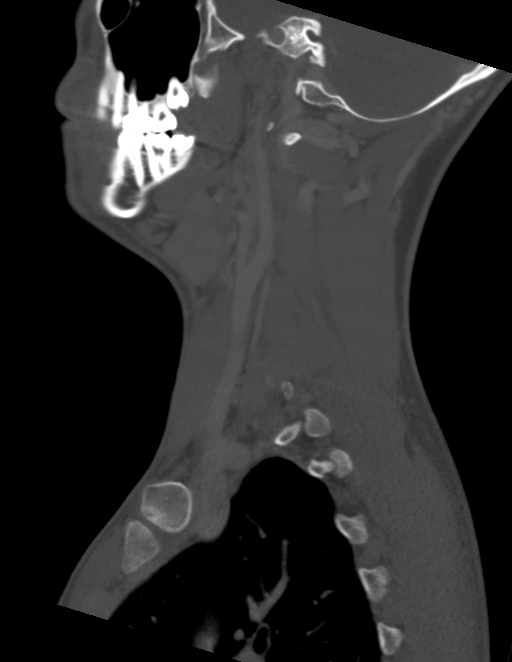

[Series 10: cor neck · coronal · 0.35mm/px · 3 of 85 slices shown]
[im 21/85  bone]
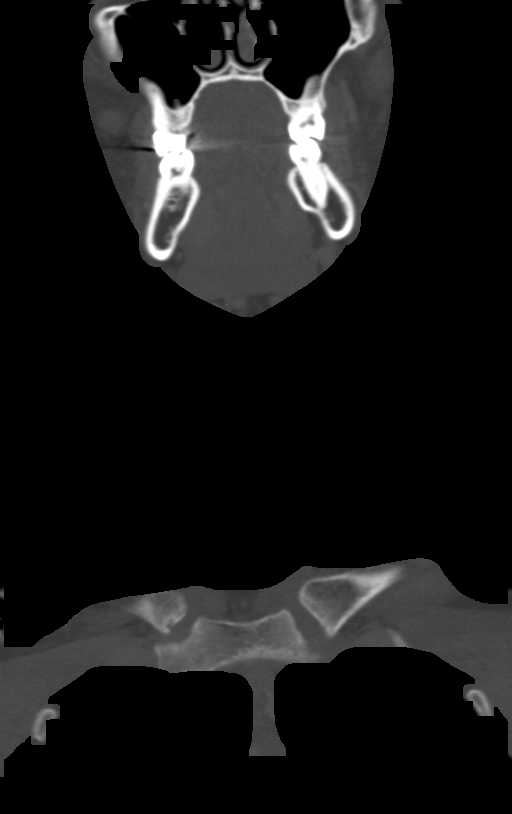
[im 35/85  bone]
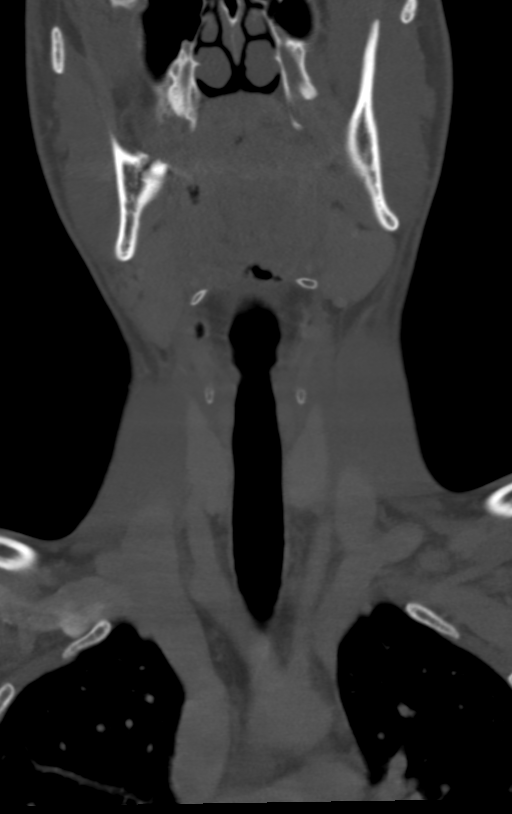
[im 50/85  bone]
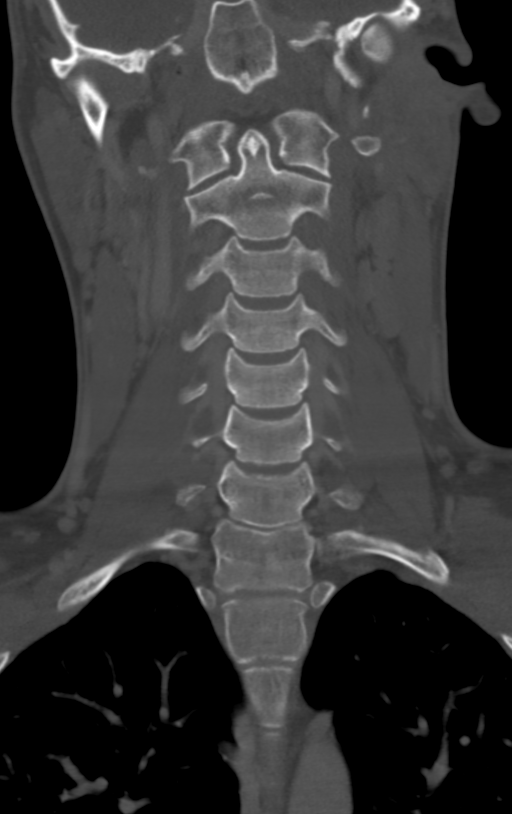

[Series 11: orthogonal ax · axial · 0.33mm/px · 1 of 139 slices shown]
[im 28/139  bone]
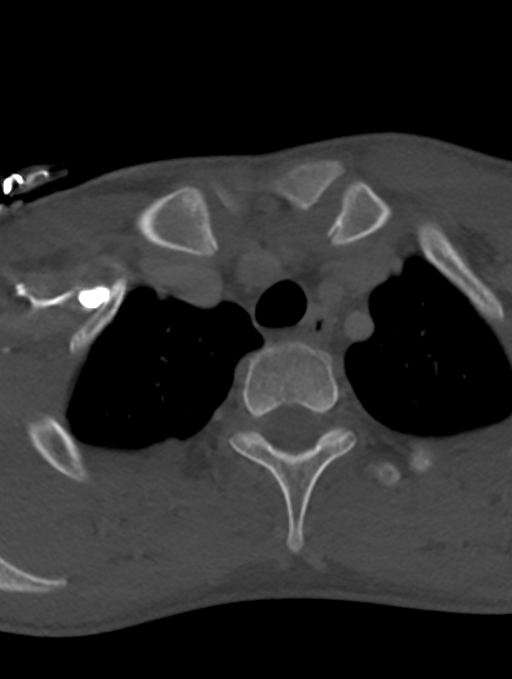

[13 of 33 positions shown; findings below may reference images not displayed]

FINDINGS: PHARYNX AND LARYNX:

--Nasopharynx: Fossae of [place] are clear. Normal adenoid
tonsils for age.

--Oral cavity and oropharynx: The palatine tonsils are enlarged
without fluid collection or abscess. Unremarkable lingual tonsils.

--Hypopharynx: Normal vallecula and pyriform sinuses.

--Larynx: Normal epiglottis and pre-epiglottic space. Normal
aryepiglottic and vocal folds.

--Retropharyngeal space: No abscess, effusion or lymphadenopathy.

SALIVARY GLANDS:

--Parotid: No mass lesion or inflammation. No sialolithiasis or
ductal dilatation.

--Submandibular: Symmetric without inflammation. No sialolithiasis
or ductal dilatation.

--Sublingual: Normal. No ranula or other visible lesion of the base
of tongue and floor of mouth.

THYROID: Normal.

LYMPH NODES: Right level 2A nodes measure up to 15 mm. Left level 2A
nodes measure up to 17 mm.

VASCULAR: Major cervical vessels are patent.

LIMITED INTRACRANIAL: Normal.

VISUALIZED ORBITS: Normal.

MASTOIDS AND VISUALIZED PARANASAL SINUSES: No fluid levels or
advanced mucosal thickening. No mastoid effusion.

SKELETON: No bony spinal canal stenosis. No lytic or blastic
lesions.

UPPER CHEST: Clear.

OTHER: None.
IMPRESSION: 1. Enlarged palatine tonsils without peritonsillar abscess or other
fluid collection, consistent with tonsillopharyngitis.
2. Bilateral reactive cervical lymphadenopathy.
3. Normal epiglottis.  No airway stenosis.

## 2020-07-25 IMAGING — CT CT ANGIO CHEST
2 of 6 series · 18 of 36 positions shown · IV contrast (APPLIED)
Comparison: Prior radiograph from 07/23/2018

CLINICAL DATA: Initial evaluation for persistent tachycardia.

EXAM:
CT ANGIOGRAPHY CHEST WITH CONTRAST
TECHNIQUE: Multidetector CT imaging of the chest was performed using the
standard protocol during bolus administration of intravenous
contrast. Multiplanar CT image reconstructions and MIPs were
obtained to evaluate the vascular anatomy.
CONTRAST:  100mL OMNIPAQUE IOHEXOL 350 MG/ML SOLN

[Series 4: thins · axial · 0.64mm/px · z∈[-617,-356]mm · 17 of 291 slices shown]
[im 15/291  lung]
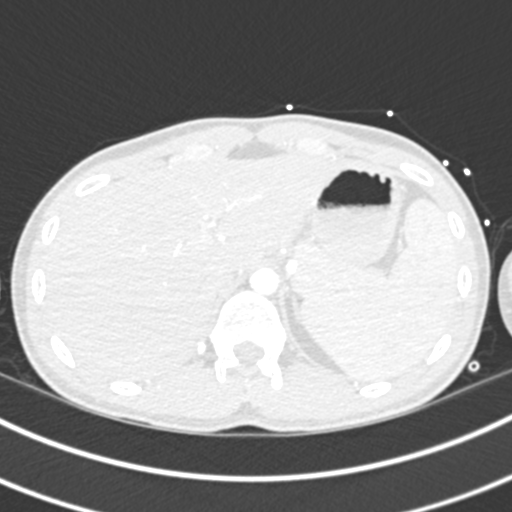
[im 30/291  mediastinal]
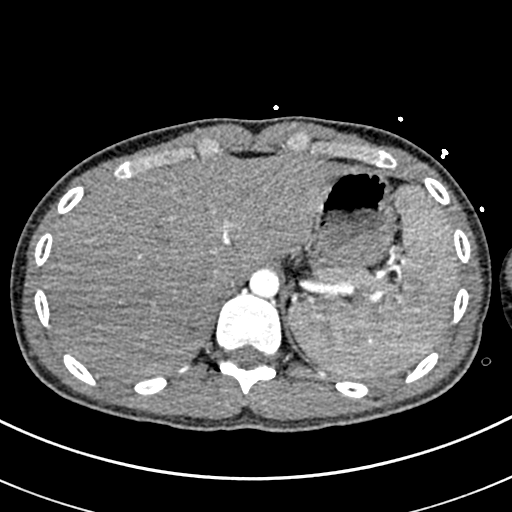
[im 44/291  lung]
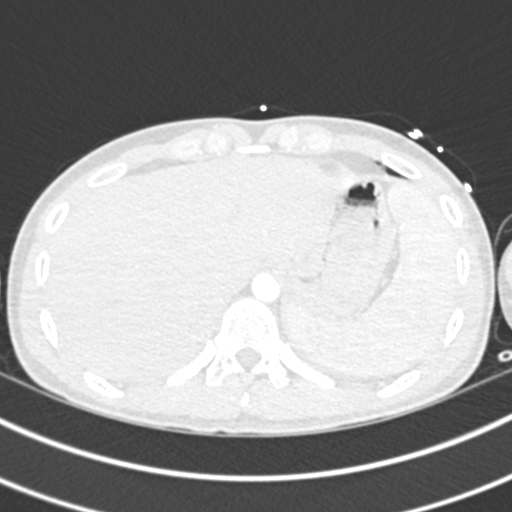
[im 59/291  mediastinal]
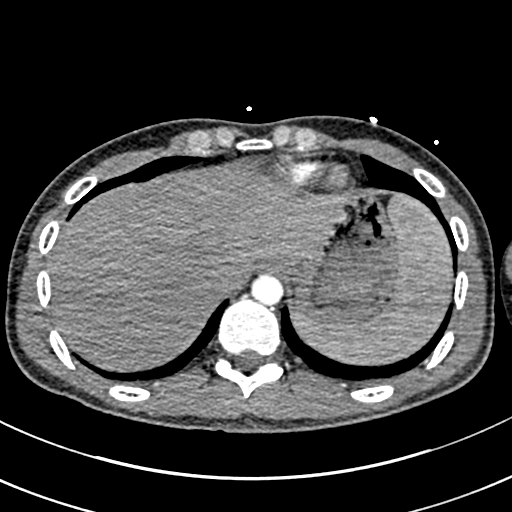
[im 88/291  lung]
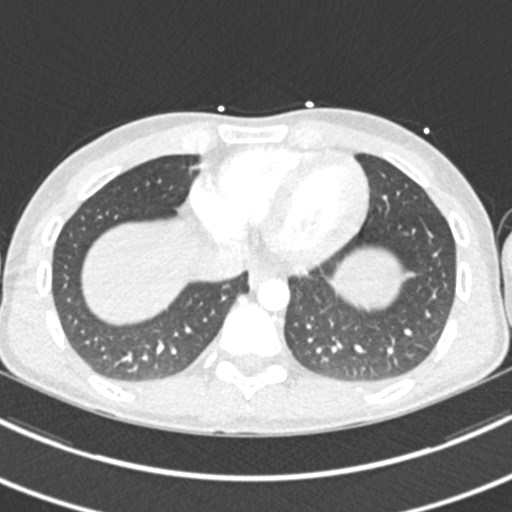
[im 102/291  mediastinal]
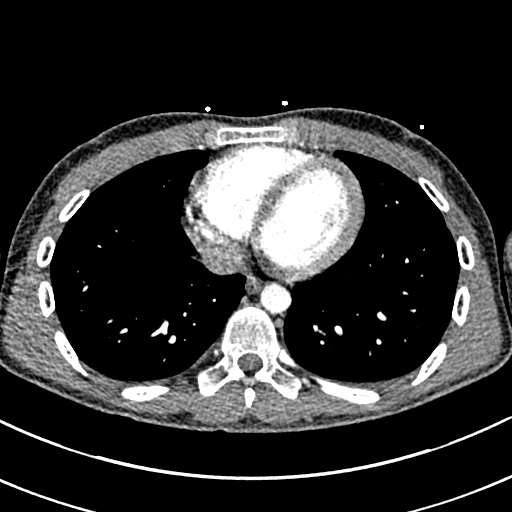
[im 117/291  lung]
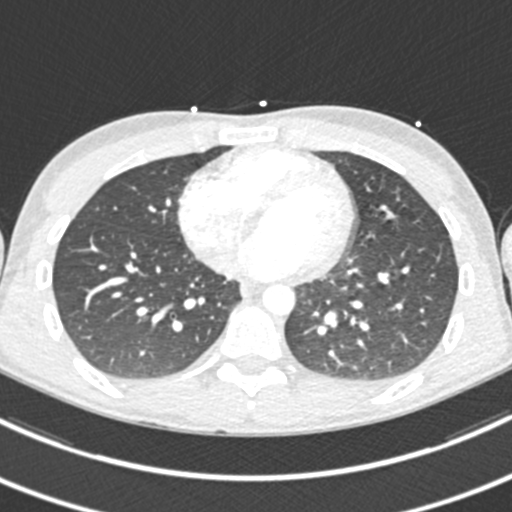
[im 131/291  mediastinal]
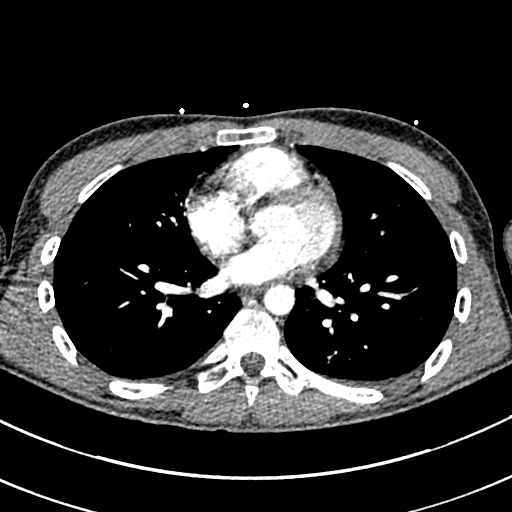
[im 146/291  lung]
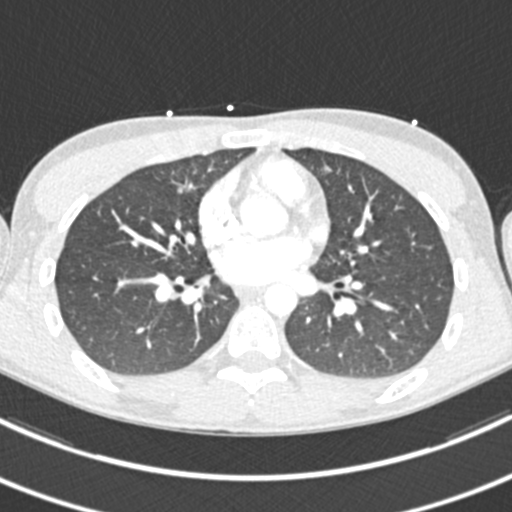
[im 160/291  mediastinal]
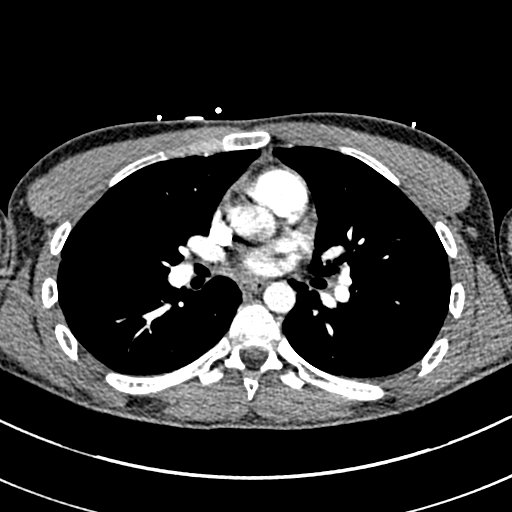
[im 175/291  lung]
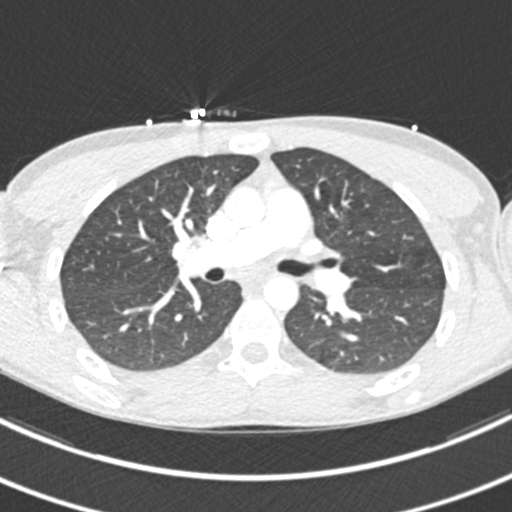
[im 189/291  mediastinal]
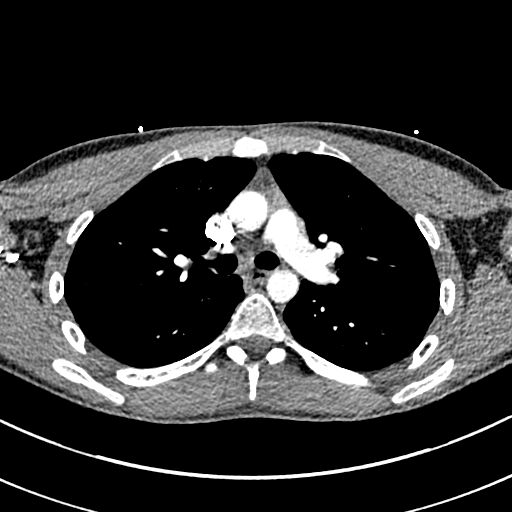
[im 204/291  lung]
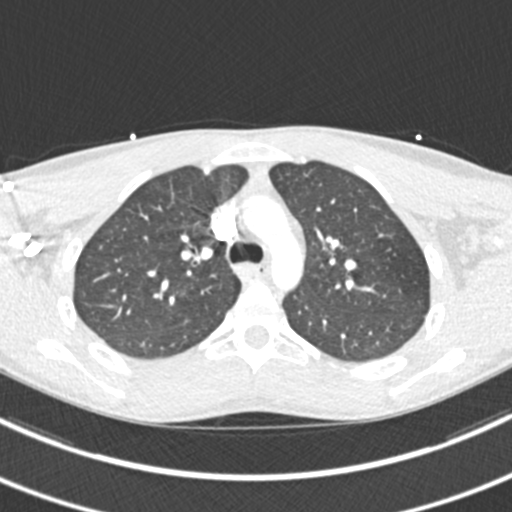
[im 233/291  mediastinal]
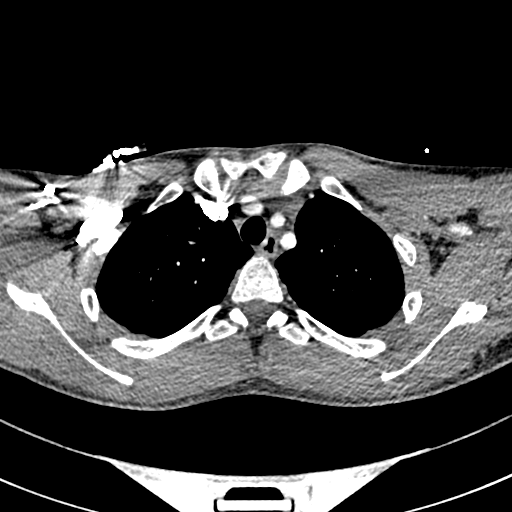
[im 247/291  lung]
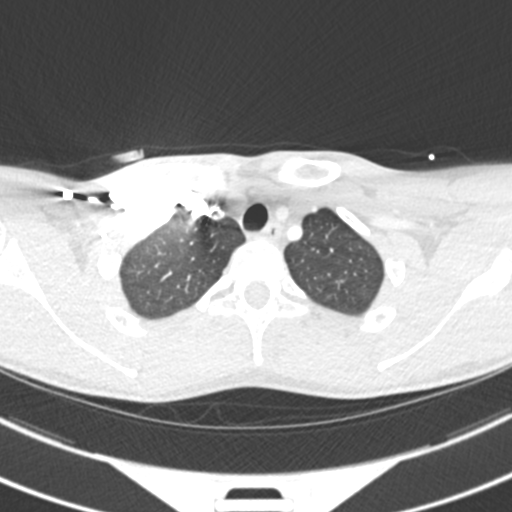
[im 262/291  mediastinal]
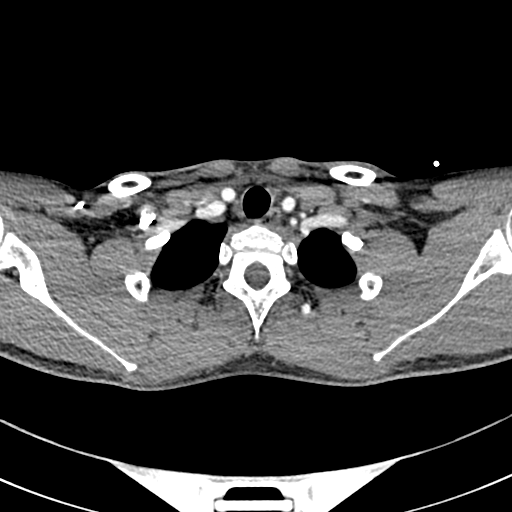
[im 276/291  lung]
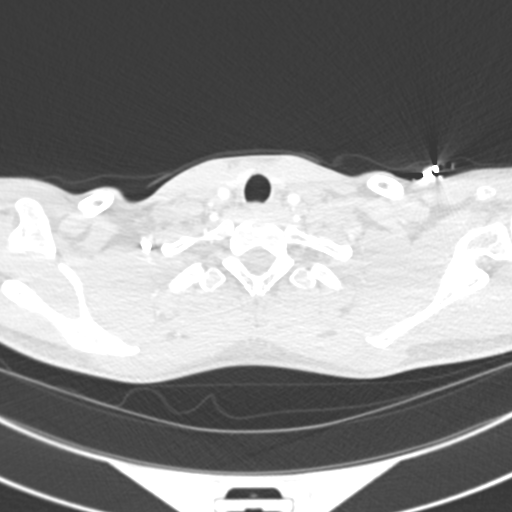

[Series 6: coronal mpr · coronal · 0.57mm/px · 1 of 65 slices shown]
[im 33/65  mediastinal]
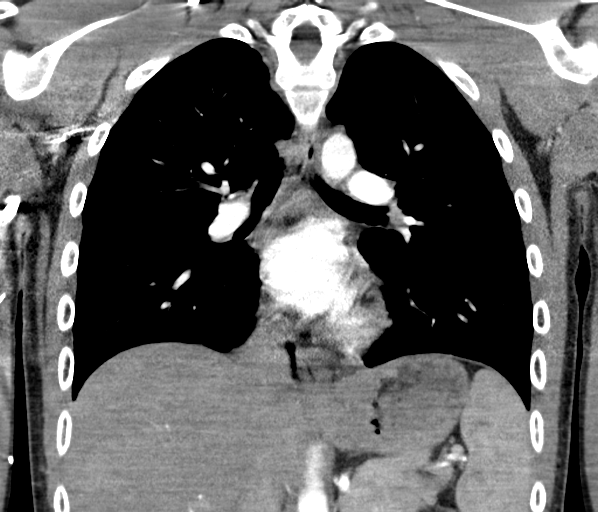

[18 of 36 positions shown; findings below may reference images not displayed]

FINDINGS: Cardiovascular: Intrathoracic aorta of normal caliber without
aneurysm or other acute abnormality. Visualized great vessels within
normal limits. Heart size normal. No pericardial effusion.

Pulmonary arterial tree adequately opacified for evaluation. Main
pulmonary artery within normal limits for caliber. No filling defect
to suggest acute pulmonary embolism. Re-formatted imaging confirms
these findings.

Mediastinum/Nodes: Visualized thyroid is normal. No enlarged
mediastinal, hilar, or axillary lymph nodes identified. Normal
residual thymic tissue noted within the anterior mediastinum.
Esophagus within normal limits.

Lungs/Pleura: Tracheobronchial tree widely intact and patent. Lungs
are well inflated bilaterally. No focal infiltrates. No pulmonary
edema or pleural effusion. No pneumothorax. Single 6 mm nodule
present at the medial right upper lobe (series 5, image 28),
indeterminate. No other pulmonary nodule or mass.

Upper Abdomen: Visualized upper abdomen demonstrates no acute
finding.

Musculoskeletal: No acute osseous abnormality. No worrisome lytic or
blastic osseous lesions.

Review of the MIP images confirms the above findings.
IMPRESSION: 1. No CTA evidence for acute pulmonary embolism.
2. No other acute cardiopulmonary abnormality identified.
3. Single 6 mm right upper lobe pulmonary nodule as above,
indeterminate. Given patient age, finding felt to be of doubtful
clinical significance. Please note that Fleischner criteria do not
apply in patients of this age.
